# Patient Record
Sex: Male | Born: 1945 | Race: White | Hispanic: No | Marital: Married | Smoking: Former smoker
Health system: Southern US, Community
[De-identification: ages and names within clinical notes are randomized; demographics above are authoritative.]

## PROBLEM LIST (undated history)

## (undated) DIAGNOSIS — M1990 Unspecified osteoarthritis, unspecified site: Secondary | ICD-10-CM

## (undated) DIAGNOSIS — K5790 Diverticulosis of intestine, part unspecified, without perforation or abscess without bleeding: Secondary | ICD-10-CM

## (undated) DIAGNOSIS — I10 Essential (primary) hypertension: Secondary | ICD-10-CM

## (undated) DIAGNOSIS — K635 Polyp of colon: Secondary | ICD-10-CM

## (undated) HISTORY — DX: Polyp of colon: K63.5

## (undated) HISTORY — PX: APPENDECTOMY: SHX54

## (undated) HISTORY — PX: HYDROCELE EXCISION: SHX482

## (undated) HISTORY — PX: WISDOM TOOTH EXTRACTION: SHX21

## (undated) HISTORY — DX: Unspecified osteoarthritis, unspecified site: M19.90

## (undated) HISTORY — DX: Essential (primary) hypertension: I10

## (undated) HISTORY — DX: Diverticulosis of intestine, part unspecified, without perforation or abscess without bleeding: K57.90

---

## 1999-09-04 ENCOUNTER — Inpatient Hospital Stay (HOSPITAL_COMMUNITY): Admission: EM | Admit: 1999-09-04 | Discharge: 1999-09-06 | Payer: Self-pay | Admitting: Emergency Medicine

## 1999-09-04 ENCOUNTER — Encounter (INDEPENDENT_AMBULATORY_CARE_PROVIDER_SITE_OTHER): Payer: Self-pay | Admitting: Specialist

## 1999-09-04 ENCOUNTER — Encounter: Payer: Self-pay | Admitting: Emergency Medicine

## 1999-09-11 ENCOUNTER — Emergency Department (HOSPITAL_COMMUNITY): Admission: EM | Admit: 1999-09-11 | Discharge: 1999-09-11 | Payer: Self-pay | Admitting: *Deleted

## 2003-05-24 ENCOUNTER — Encounter (INDEPENDENT_AMBULATORY_CARE_PROVIDER_SITE_OTHER): Payer: Self-pay | Admitting: *Deleted

## 2003-05-24 ENCOUNTER — Ambulatory Visit (HOSPITAL_COMMUNITY): Admission: RE | Admit: 2003-05-24 | Discharge: 2003-05-24 | Payer: Self-pay | Admitting: Gastroenterology

## 2007-11-20 ENCOUNTER — Encounter: Admission: RE | Admit: 2007-11-20 | Discharge: 2007-11-20 | Payer: Self-pay | Admitting: Gastroenterology

## 2008-09-25 IMAGING — CT CT CHEST W/ CM
2 of 4 series · 15 of 36 positions shown, 18 images · IV contrast (75CC OMNI 300)
Comparison: None

CLINICAL DATA: Anterior chest wall pain.

CT CHEST WITH CONTRAST
TECHNIQUE: Multidetector CT imaging of the chest was performed
following the standard protocol during bolus administration of
intravenous contrast.
Contrast: 75 ml [id number].

[Series 3: routine chest · axial · 0.79mm/px · z∈[-290,-16]mm · 12 of 65 slices shown, 15 images]
[im 5/65  mediastinal]
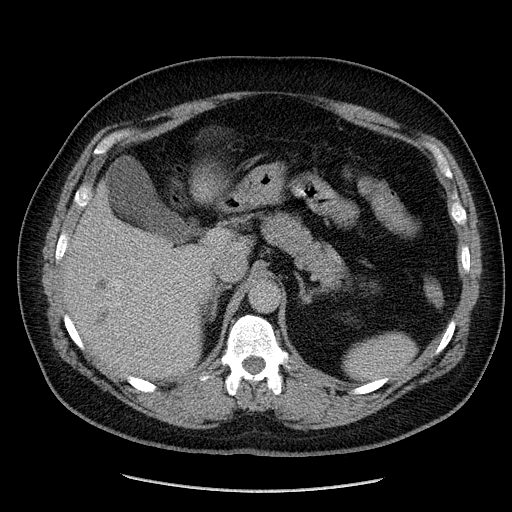
[im 5/65  lung]
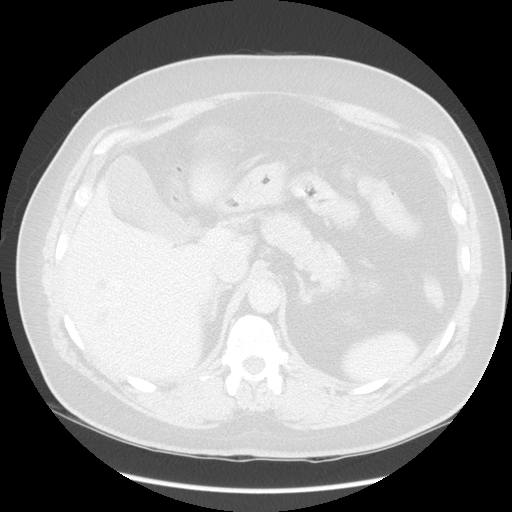
[im 10/65  lung]
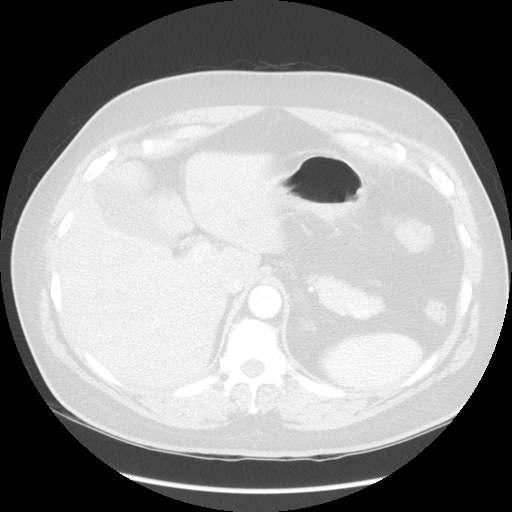
[im 14/65  lung]
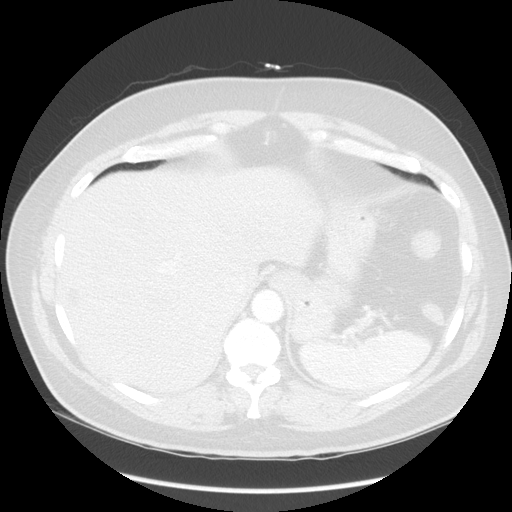
[im 19/65  lung]
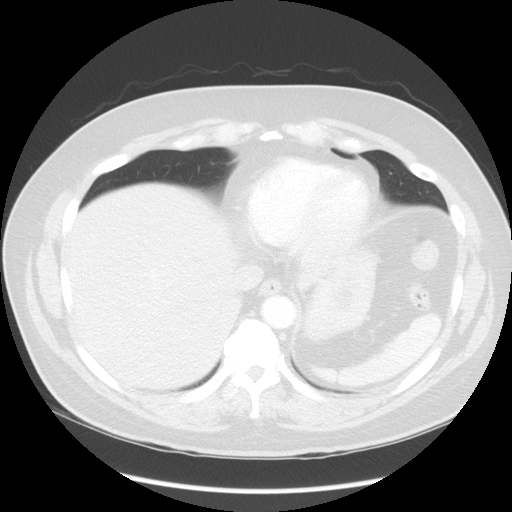
[im 23/65  mediastinal]
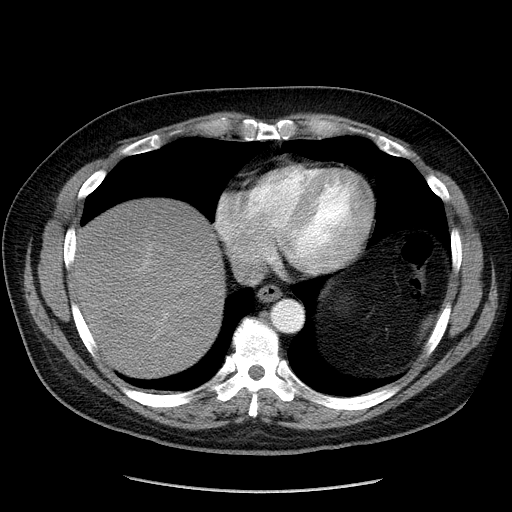
[im 23/65  lung]
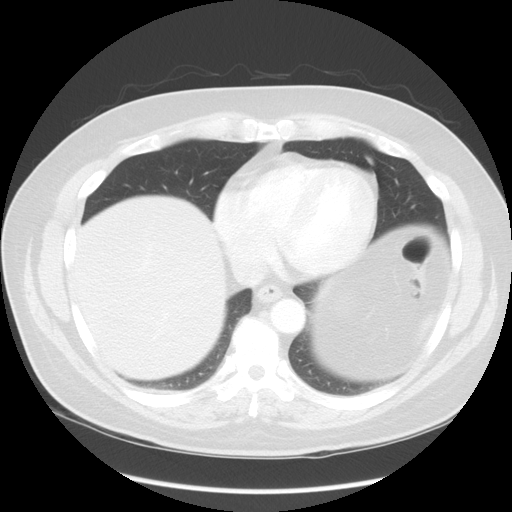
[im 28/65  lung]
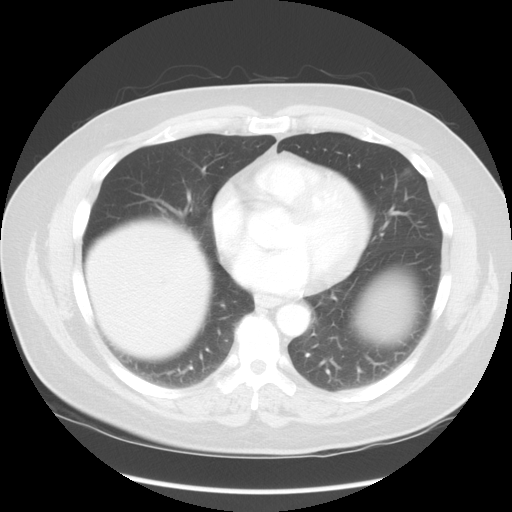
[im 37/65  lung]
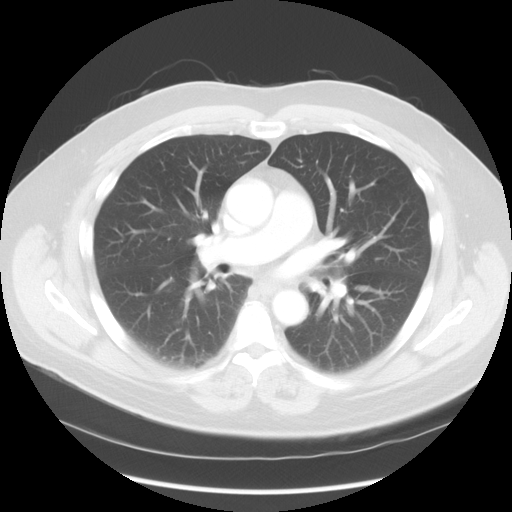
[im 42/65  lung]
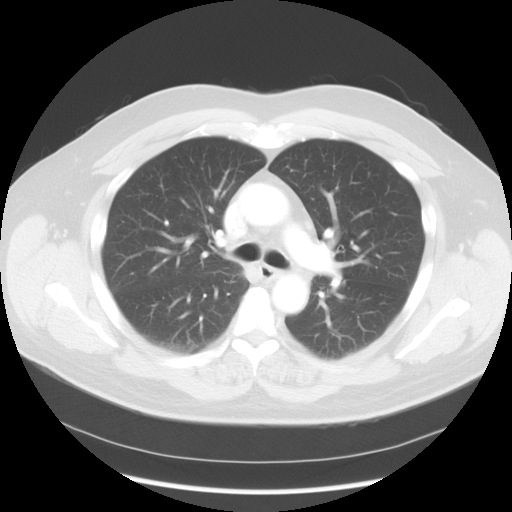
[im 46/65  mediastinal]
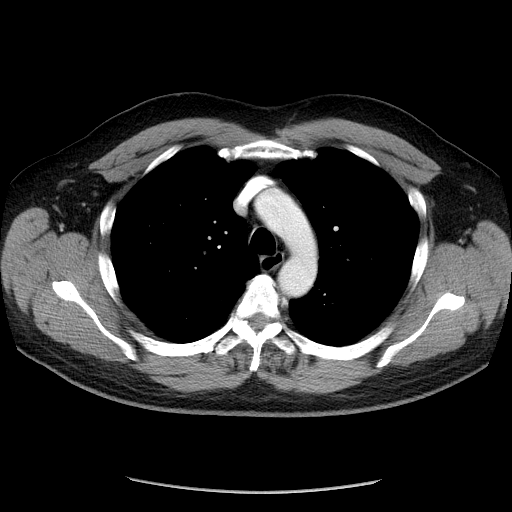
[im 46/65  lung]
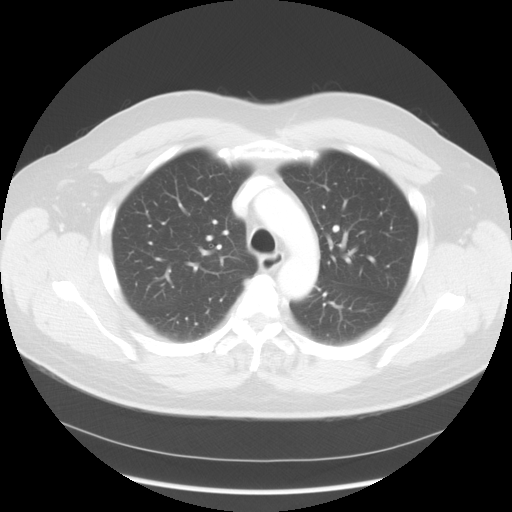
[im 51/65  lung]
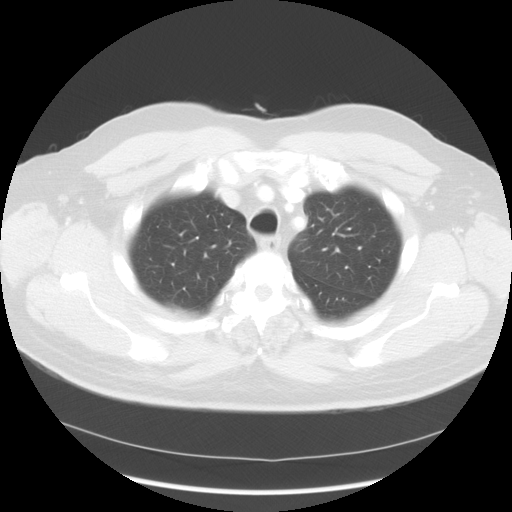
[im 55/65  lung]
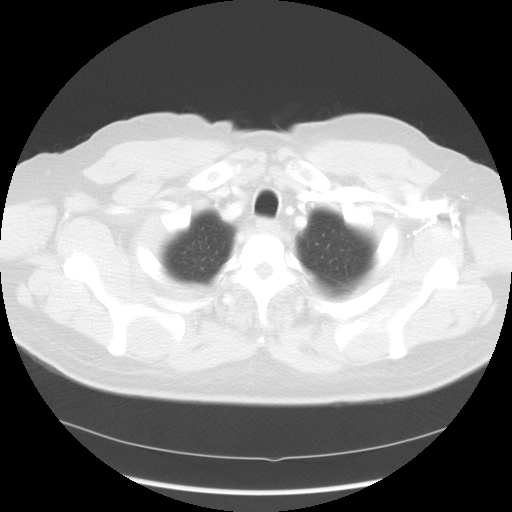
[im 60/65  lung]
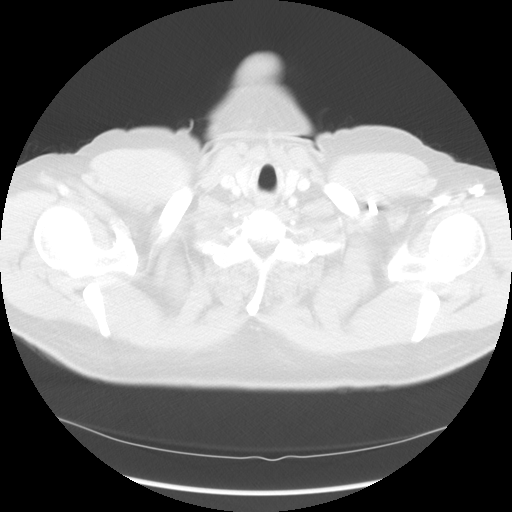

[Series 602: sagittal body · sagittal · 0.79mm/px · 3 of 163 slices shown]
[im 33/163  lung]
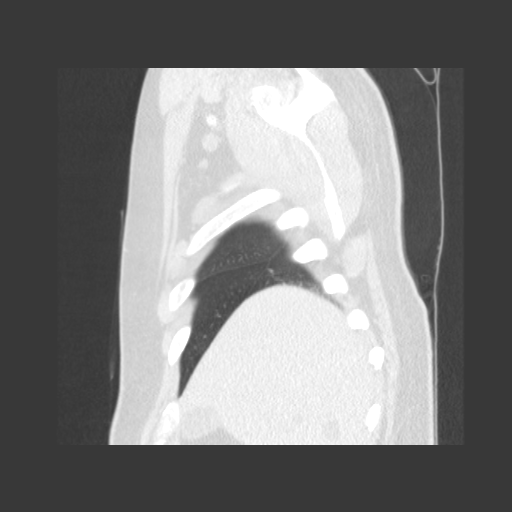
[im 65/163  lung]
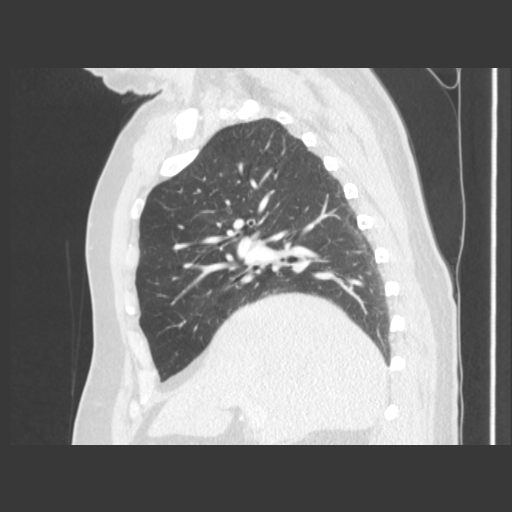
[im 98/163  lung]
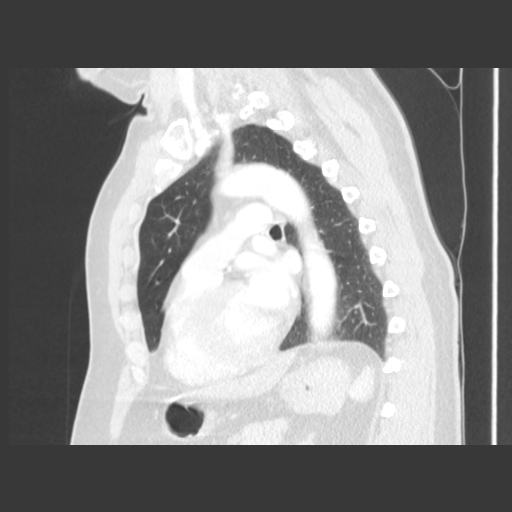

[15 of 36 positions shown; findings below may reference images not displayed]

FINDINGS: No pathologically enlarged mediastinal, hilar or axillary
lymph nodes.  Heart size normal.  No pericardial effusion.

There is scarring in the lingula.  Lungs otherwise clear.  No
pleural fluid.  Airway unremarkable.

Incidental imaging of the upper abdomen shows somewhat poorly
defined low attenuation lesions in the liver, measuring up to
cm in the right hepatic lobe.  No worrisome lytic or sclerotic
lesions.  No fracture.
IMPRESSION: 1.  No CT findings to explain the patient's given symptoms.
2.  Low attenuation lesions in the liver are somewhat ill-defined
and difficult to characterize on today's exam.  It is likely that
in the absence of known malignancy, these are cysts or hemangiomas.
However, further evaluation with MR could be performed, as
clinically indicated.

## 2009-02-17 ENCOUNTER — Emergency Department (HOSPITAL_COMMUNITY): Admission: EM | Admit: 2009-02-17 | Discharge: 2009-02-17 | Payer: Self-pay | Admitting: Emergency Medicine

## 2009-12-24 IMAGING — CR DG CHEST 1V PORT
1 series · 1 of 1 positions shown · non-contrast
Comparison: CT [DATE]/ 4446

CLINICAL DATA: Trauma

PORTABLE CHEST - 1 VIEW

[view not recorded]
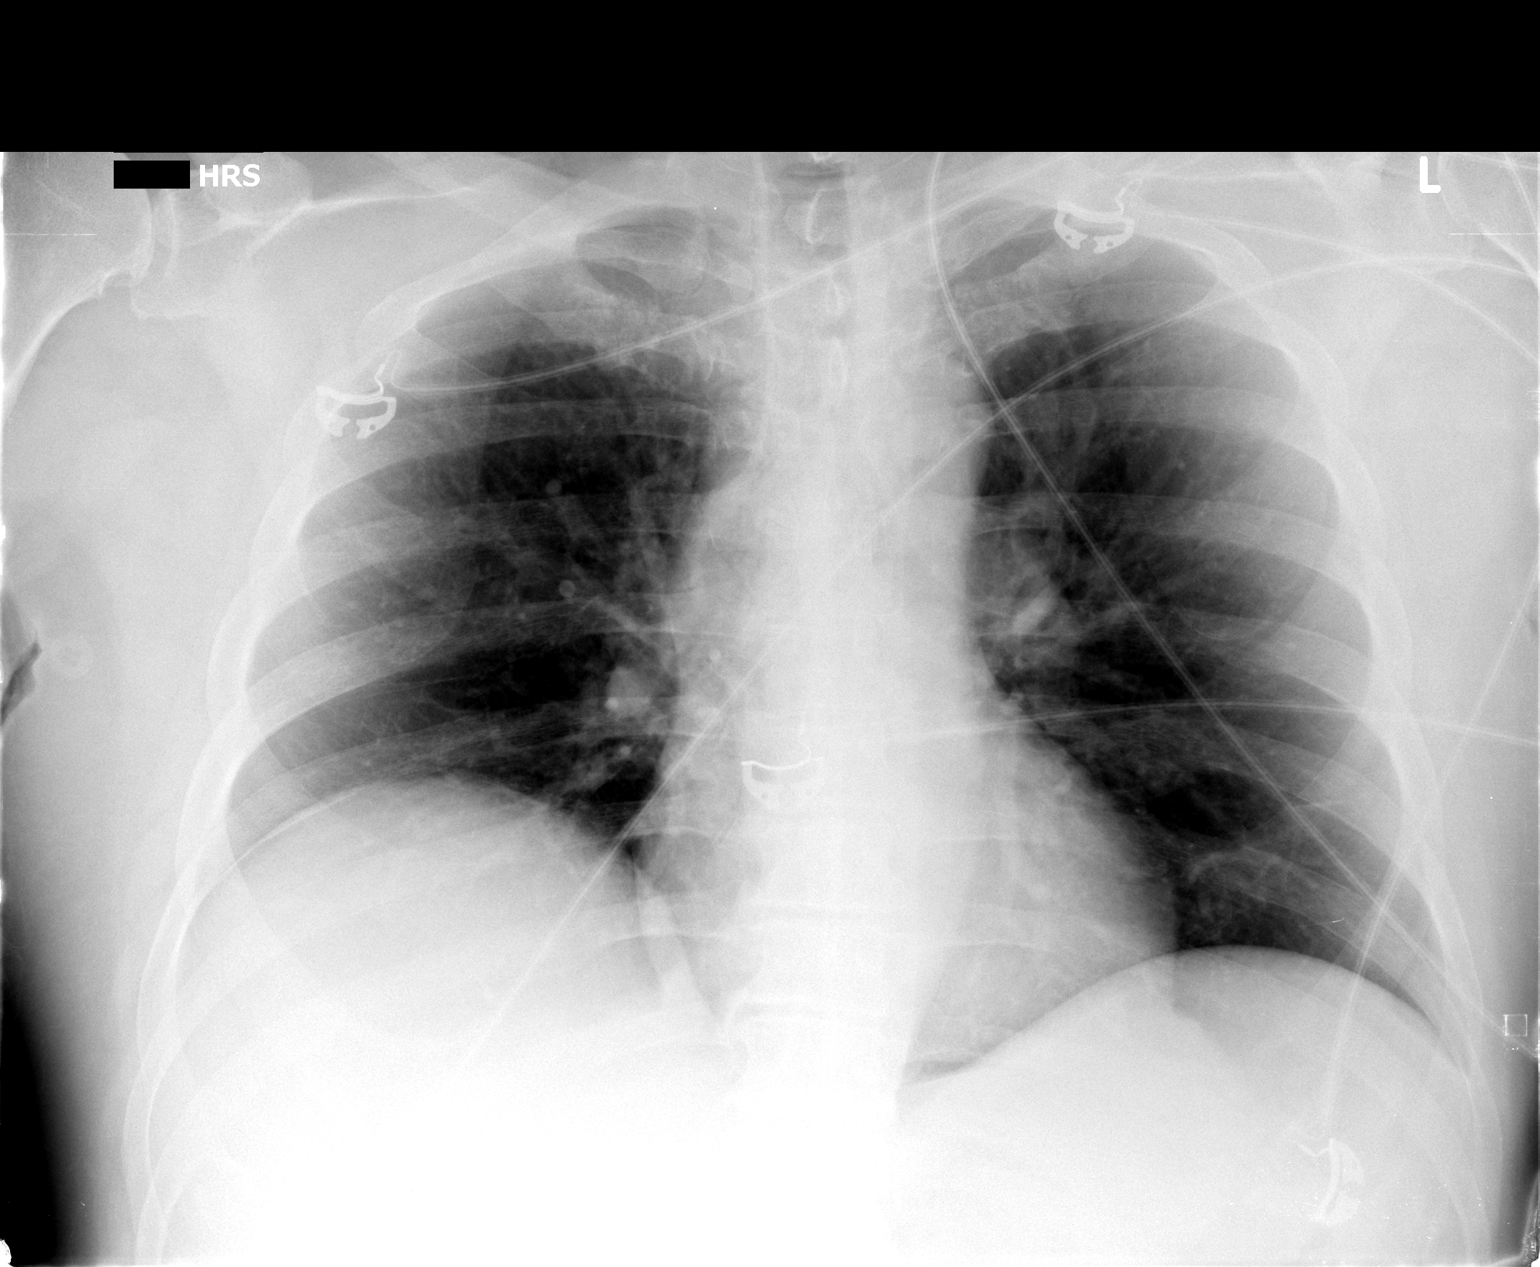

[1 of 1 positions shown; findings below may reference images not displayed]

FINDINGS: Normal mediastinum and cardiac silhouette.  Costophrenic
angles are clear.  No evidence of pleural fluid.  No pneumothorax.
No evidence of fracture.
IMPRESSION: No radiographic evidence of thoracic trauma.

## 2011-10-04 ENCOUNTER — Other Ambulatory Visit: Payer: Self-pay | Admitting: Gastroenterology

## 2017-06-27 ENCOUNTER — Telehealth: Payer: Self-pay | Admitting: Gastroenterology

## 2017-06-28 ENCOUNTER — Encounter: Payer: Self-pay | Admitting: Gastroenterology

## 2017-06-28 NOTE — Telephone Encounter (Signed)
Patient has been scheduled for an office visit 08/19/17 with Dr.Danis.

## 2017-06-28 NOTE — Telephone Encounter (Signed)
Please make him a new patient appointment to see me and I will review the records at that time. 

## 2017-08-19 ENCOUNTER — Encounter: Payer: Self-pay | Admitting: Gastroenterology

## 2017-08-19 ENCOUNTER — Ambulatory Visit (INDEPENDENT_AMBULATORY_CARE_PROVIDER_SITE_OTHER): Payer: Non-veteran care | Admitting: Gastroenterology

## 2017-08-19 VITALS — BP 124/68 | HR 70 | Ht 70.0 in | Wt 231.0 lb

## 2017-08-19 DIAGNOSIS — Z86010 Personal history of colonic polyps: Secondary | ICD-10-CM | POA: Diagnosis not present

## 2017-08-19 NOTE — Patient Instructions (Signed)
If you are age 72 or older, your body mass index should be between 23-30. Your Body mass index is 33.15 kg/m. If this is out of the aforementioned range listed, please consider follow up with your Primary Care Provider.  If you are age 72 or younger, your body mass index should be between 19-25. Your Body mass index is 33.15 kg/m. If this is out of the aformentioned range listed, please consider follow up with your Primary Care Provider.   You will be due for a recall colonoscopy in 3- 2023. We will send you a reminder in the mail when it gets closer to that time.  Thank you for choosing [facility]  Dr Amada JupiterHenry Danis III

## 2017-08-19 NOTE — Progress Notes (Signed)
[facility] Consult Note:  History: Tommy Stewart 08/19/2017  Referring physician: Eartha InchBadger, Michael C, MD  Reason for consult/chief complaint: Colon Cancer Screening (last colon was 10 years or more. pt denies GI sx.)   Subjective  HPI:  This is a 72 year old man [facility].  Review of available records reveals a colonoscopy with Dr. Reece AgarMarty Johnson in March 2013.  It was complete exam with a reportedly good preparation, and only 2 diminutive rectal hyperplastic polyps were removed. The patient denies chronic upper digestive symptoms such as heartburn, nausea, vomiting or dysphagia.  He denies chronic abdominal pain change in bowel habits or rectal bleeding.   ROS:  Review of Systems  He denies chest pain dyspnea or dysuria Past Medical History: Past Medical History:  Diagnosis Date  . Colon polyp   . Diverticulosis   . DJD (degenerative joint disease)   . Hypertension      Past Surgical History: Past Surgical History:  Procedure Laterality Date  . APPENDECTOMY    . HYDROCELE EXCISION    . WISDOM TOOTH EXTRACTION       Family History: Family History  Problem Relation Age of Onset  . Cancer - Other Mother        unknown primary   . COPD Father   . Heart disease Father        ASHD  . Hypertension Sister    No colon or rectal cancer  Social History: Social History   Socioeconomic History  . Marital status: Married    Spouse name: None  . Number of children: None  . Years of education: None  . Highest education level: None  Social Needs  . Financial resource strain: None  . Food insecurity - worry: None  . Food insecurity - inability: None  . Transportation needs - medical: None  . Transportation needs - non-medical: None  Occupational History  . None  Tobacco Use  . Smoking status: Former Games developermoker  . Smokeless tobacco: Former Engineer, waterUser  Substance and Sexual Activity    . Alcohol use: Yes    Frequency: Never    Comment: 3 x a week  . Drug use: No  . Sexual activity: None  Other Topics Concern  . None  Social History Narrative  . None    Allergies: Allergies  Allergen Reactions  . Hydrocodone Itching    Outpatient Meds: Current Outpatient Medications  Medication Sig Dispense Refill  . amLODipine (NORVASC) 5 MG tablet Take 5 mg by mouth daily.    . Cetirizine HCl 10 MG CAPS Take 1 capsule by mouth daily.    . Cholecalciferol (VITAMIN D3) 5000 units CAPS Take 2 capsules by mouth daily.    . Glucosamine-Chondroit-Vit C-Mn (GLUCOSAMINE 1500 COMPLEX PO) Take 2 capsules by mouth daily.    . hydrochlorothiazide (HYDRODIURIL) 12.5 MG tablet Take 12.5 mg by mouth daily.    Marland Kitchen. lisinopril (PRINIVIL,ZESTRIL) 40 MG tablet Take 40 mg by mouth daily.    . Omega-3 Fatty Acids (FISH OIL) 1200 MG CAPS Take 2 capsules by mouth daily.    . Turmeric (CURCUMIN 95) 500 MG CAPS Take 2 capsules by mouth daily.     No current facility-administered medications for this visit.       ___________________________________________________________________ Objective   Exam:  BP 124/68   Pulse 70   Ht 5\' 10"  (1.778 m)   Wt 231 lb (104.8 kg)   BMI  33.15 kg/m    General: this is a(n) well-appearing man  Eyes: sclera anicteric, no redness  ENT: oral mucosa moist without lesions, no cervical or supraclavicular lymphadenopathy, good dentition  CV: RRR without murmur, S1/S2, no JVD, no peripheral edema  Resp: clear to auscultation bilaterally, normal RR and effort noted  GI: soft, overweight, no tenderness, with active bowel sounds. No guarding or palpable organomegaly noted.  Skin; warm and dry, no rash or jaundice noted  Neuro: awake, alert and oriented x 3. Normal gross motor function and fluent speech  Labs:  Records as noted above  Assessment: Encounter Diagnosis  Name Primary?  . History of colonic polyps Yes    Although he was advised by Dr.  Laural Benes to have a repeat colonoscopy at a 5-year interval, current screening guidelines indicated should be a 10-year interval for him.  Plan:  We have placed the patient on our recall list from March 2023 since he plans to follow with [place] after Dr. Laural Benes retired last year.  Thank you for the courtesy of this consult.  Please call me with any questions or concerns.  Charlie Pitter III  CC: Eartha Inch, MD

## 2019-01-22 ENCOUNTER — Other Ambulatory Visit: Payer: Self-pay | Admitting: Internal Medicine

## 2019-01-22 ENCOUNTER — Other Ambulatory Visit: Payer: Self-pay

## 2019-01-22 DIAGNOSIS — Z20822 Contact with and (suspected) exposure to covid-19: Secondary | ICD-10-CM

## 2019-01-25 LAB — NOVEL CORONAVIRUS, NAA: SARS-CoV-2, NAA: NOT DETECTED

## 2019-01-26 ENCOUNTER — Telehealth: Payer: Self-pay | Admitting: *Deleted

## 2019-01-26 NOTE — Telephone Encounter (Signed)
Pt had called in earlier requesting his COVID-19 test result.   I was not able to locate him in the system under the name Tommy Stewart. After ending the call it occurred to me that his given name may be Tommy Stewart instead of [place].   I looked him up and found his chart in the system.   His COVID-19 result was non-detected. I called him back and told him how I found him in the system and that I was glad to report his COVID-19 test was negative meaning he did not have the virus.  He was very happy and appreciative of the result and that I looked into it further and called him back.    "You have made my holiday (July 4th) weekend!

## 2020-04-24 ENCOUNTER — Ambulatory Visit: Payer: Non-veteran care | Attending: Internal Medicine

## 2020-04-24 DIAGNOSIS — Z23 Encounter for immunization: Secondary | ICD-10-CM

## 2020-04-24 NOTE — Progress Notes (Signed)
   Covid-19 Vaccination Clinic  Name:  Tommy Stewart    MRN: [id number] DOB: [birth year]  04/24/2020  Tommy Stewart was observed post Covid-19 immunization for 15 minutes without incident. He was provided with Vaccine Information Sheet and instruction to access the V-Safe system.   Tommy Stewart was instructed to call 911 with any severe reactions post vaccine: Marland Kitchen Difficulty breathing  . Swelling of face and throat  . A fast heartbeat  . A bad rash all over body  . Dizziness and weakness

## 2021-07-06 LAB — COLOGUARD: COLOGUARD: NEGATIVE

## 2021-09-15 ENCOUNTER — Other Ambulatory Visit: Payer: Self-pay

## 2021-09-15 ENCOUNTER — Encounter: Payer: Self-pay | Admitting: Emergency Medicine

## 2021-09-15 ENCOUNTER — Ambulatory Visit
Admission: EM | Admit: 2021-09-15 | Discharge: 2021-09-15 | Disposition: A | Discharge status: Home / Self Care | Payer: Medicare HMO | Attending: Family Medicine | Admitting: Family Medicine

## 2021-09-15 DIAGNOSIS — S61011A Laceration without foreign body of right thumb without damage to nail, initial encounter: Secondary | ICD-10-CM

## 2021-09-15 MED ORDER — LIDOCAINE-EPINEPHRINE-TETRACAINE (LET) TOPICAL GEL
3.0000 mL | Freq: Once | TOPICAL | Status: AC
  Administered 2021-09-15: 3 mL via TOPICAL

## 2021-09-15 NOTE — ED Notes (Signed)
Dressing applied to laceration post dermabond administration by PA. Finger splint, Non adherent telfa applied to laceration site. Reinforced with gauze layer. Secured with coban. Pt tolerated well.   Site management education provided by PA.

## 2021-09-15 NOTE — ED Triage Notes (Signed)
Pt reports was cutting cheese at home and reports knife slipped and cut right thumb. Mild bleeding noted. PA in to assess site. LET applied verbal order. Pt tolerating well.

## 2021-09-15 NOTE — ED Provider Notes (Signed)
[facility]    CSN: [id number] Arrival date & time: 09/15/21  1718      History   Chief Complaint Chief Complaint  Patient presents with   Laceration    HPI Tommy Stewart is a 76 y.o. male.   Presenting today with a laceration to the dorsal surface of the right thumb that occurred several hours ago when he was cutting a wedge of cheese at home.  He states the bleeding has been poorly controlled with direct pressure thus far.  Range of motion intact, no numbness, tingling, damage to the nail.  Has run it under water thus far but otherwise not tried anything for it.  Not on any anticoagulation.   Past Medical History:  Diagnosis Date   Colon polyp    Diverticulosis    DJD (degenerative joint disease)    Hypertension     There are no problems to display for this patient.   Past Surgical History:  Procedure Laterality Date   APPENDECTOMY     HYDROCELE EXCISION     WISDOM TOOTH EXTRACTION         Home Medications    Prior to Admission medications   Medication Sig Start Date End Date Taking? Authorizing Provider  amLODipine (NORVASC) 5 MG tablet Take 5 mg by mouth daily.    [provider]  Cetirizine HCl 10 MG CAPS Take 1 capsule by mouth daily.    [provider]  Cholecalciferol (VITAMIN D3) 5000 units CAPS Take 2 capsules by mouth daily.    [provider]  Glucosamine-Chondroit-Vit C-Mn (GLUCOSAMINE 1500 COMPLEX PO) Take 2 capsules by mouth daily.    [provider]  hydrochlorothiazide (HYDRODIURIL) 12.5 MG tablet Take 12.5 mg by mouth daily.    [provider]  lisinopril (PRINIVIL,ZESTRIL) 40 MG tablet Take 40 mg by mouth daily.    [provider]  Omega-3 Fatty Acids (FISH OIL) 1200 MG CAPS Take 2 capsules by mouth daily.    [provider]  Turmeric (CURCUMIN 95) 500 MG CAPS Take 2 capsules by mouth daily.    [provider]    Family History Family History  Problem  Relation Age of Onset   Cancer - Other Mother        unknown primary    COPD Father    Heart disease Father        ASHD   Hypertension Sister     Social History Social History   Tobacco Use   Smoking status: Former   Smokeless tobacco: Former  Substance Use Topics   Alcohol use: Yes    Comment: 3 x a week   Drug use: No     Allergies   Hydrocodone   Review of Systems Review of Systems Per HPI  Physical Exam Triage Vital Signs ED Triage Vitals  Enc Vitals Group     BP 09/15/21 1731 128/74     Pulse Rate 09/15/21 1731 68     Resp 09/15/21 1731 18     Temp 09/15/21 1731 98.8 F (37.1 C)     Temp Source 09/15/21 1731 Oral     SpO2 09/15/21 1731 93 %     Weight 09/15/21 1732 230 lb (104.3 kg)     Height 09/15/21 1732 5\' 10"  (1.778 m)     Head Circumference --      Peak Flow --      Pain Score 09/15/21 1732 0     Pain Loc --  Pain Edu? --      Excl. in GC? --    No data found.  Updated Vital Signs BP 128/74 (BP Location: Right Arm)    Pulse 68    Temp 98.8 F (37.1 C) (Oral)    Resp 18    Ht 5\' 10"  (1.778 m)    Wt 230 lb (104.3 kg)    SpO2 93%    BMI 33.00 kg/m   Visual Acuity Right Eye Distance:   Left Eye Distance:   Bilateral Distance:    Right Eye Near:   Left Eye Near:    Bilateral Near:     Physical Exam Vitals and nursing note reviewed.  Constitutional:      Appearance: Normal appearance.  HENT:     Head: Atraumatic.  Eyes:     Extraocular Movements: Extraocular movements intact.     Conjunctiva/sclera: Conjunctivae normal.  Cardiovascular:     Rate and Rhythm: Normal rate and regular rhythm.  Pulmonary:     Effort: Pulmonary effort is normal.     Breath sounds: Normal breath sounds.  Musculoskeletal:        General: Signs of injury present. No swelling or tenderness. Normal range of motion.     Cervical back: Normal range of motion and neck supple.  Skin:    General: Skin is warm and dry.     Comments: 1.5 cm well approximated  laceration to the dorsal aspect of the DIP of the right thumb.  Bleeding well controlled after let gel applied for 15 minutes.  Neurological:     General: No focal deficit present.     Mental Status: He is oriented to person, place, and time.     Comments: Right hand neurovascularly intact  Psychiatric:        Mood and Affect: Mood normal.        Thought Content: Thought content normal.        Judgment: Judgment normal.     UC Treatments / Results  Labs (all labs ordered are listed, but only abnormal results are displayed) Labs Reviewed - No data to display  EKG   Radiology No results found.  Procedures Laceration Repair  Date/Time: 09/15/2021 7:28 PM Performed by: 09/17/2021, PA-C Authorized by: Particia Nearing, PA-C   Consent:    Consent obtained:  Verbal   Consent given by:  Patient   Risks, benefits, and alternatives were discussed: yes     Risks discussed:  Infection and pain   Alternatives discussed: Sutures. Universal protocol:    Procedure explained and questions answered to patient or proxy's satisfaction: yes     Relevant documents present and verified: yes     Test results available: yes     Patient identity confirmed:  Verbally with patient Anesthesia:    Anesthesia method:  Topical application   Topical anesthetic:  LET Laceration details:    Location:  Finger   Finger location:  R thumb   Length (cm):  1.5   Depth (mm):  2 Pre-procedure details:    Preparation:  Patient was prepped and draped in usual sterile fashion Exploration:    Limited defect created (wound extended): no     Hemostasis achieved with:  LET and direct pressure   Wound exploration: wound explored through full range of motion     Contaminated: no   Treatment:    Area cleansed with:  Chlorhexidine   Amount of cleaning:  Standard   Debridement:  None Skin  repair:    Repair method:  Tissue adhesive Approximation:    Approximation:  Close Repair type:     Repair type:  Simple Post-procedure details:    Dressing:  Splint for protection and non-adherent dressing   Procedure completion:  Tolerated well, no immediate complications (including critical care time)  Medications Ordered in UC Medications  lidocaine-EPINEPHrine-tetracaine (LET) topical gel (3 mLs Topical Given 09/15/21 1728)    Initial Impression / Assessment and Plan / UC Course  I have reviewed the triage vital signs and the nursing notes.  Pertinent labs & imaging results that were available during my care of the patient were reviewed by me and considered in my medical decision making (see chart for details).     Let gel applied in triage which controlled the bleeding well.  Skin glue was then applied and nonstick dressing, splint for protection.  Up-to-date on tetanus per patient, had it in the last year or 2.  Discussed home wound care, return precautions.  Final Clinical Impressions(s) / UC Diagnoses   Final diagnoses:  Laceration of right thumb without foreign body without damage to nail, initial encounter   Discharge Instructions   None    ED Prescriptions   None    PDMP not reviewed this encounter.   Particia Nearing, [place] 09/15/21 1930

## 2021-09-15 NOTE — ED Notes (Signed)
No bleeding noted at this time. Site bandaged with non-adherent pad and back in waiting room until treatment room opens. Pt verbalized understanding of care plan.

## 2024-06-02 ENCOUNTER — Ambulatory Visit: Admission: EM | Admit: 2024-06-02 | Discharge: 2024-06-02 | Disposition: A | Discharge status: Home / Self Care

## 2024-06-02 DIAGNOSIS — U071 COVID-19: Secondary | ICD-10-CM | POA: Diagnosis not present

## 2024-06-02 MED ORDER — PAXLOVID (300/100) 20 X 150 MG & 10 X 100MG PO TBPK: 3.0000 | ORAL_TABLET | Freq: Two times a day (BID) | ORAL | 0 refills | Status: AC

## 2024-06-02 MED ORDER — GUAIFENESIN 100 MG/5ML PO LIQD: 10.0000 mL | Freq: Four times a day (QID) | ORAL | 0 refills | Status: AC | PRN

## 2024-06-02 NOTE — Discharge Instructions (Signed)
 Take medication as prescribed. As discussed, you will need to hold your rosuvastatin (Crestor) for 2 weeks. Increase fluids and allow for plenty of rest. You may take over-the-counter Tylenol as needed for pain, fever, or general discomfort. Recommend the use of normal saline nasal spray throughout the day for nasal congestion and runny nose.  As discussed, it is recommended that you begin use of your nasal spray that you have at home. Recommend the use of a humidifier in your bedroom at nighttime during sleep and to sleep elevated on pillows while cough symptoms persist. Go to the emergency department if you experience shortness of breath, difficulty breathing, or other concerns. Follow-up with your primary care physician if symptoms fail to improve. Follow-up as needed.

## 2024-06-02 NOTE — ED Triage Notes (Signed)
 Pt reports is weak, runny nose, and a cough x 4 days    Expired home covid test was positive

## 2024-06-02 NOTE — ED Provider Notes (Signed)
 [facility]    CSN: [id number] Arrival date & time: 06/02/24  1007      History   Chief Complaint No chief complaint on file.   HPI Tommy Stewart is a 78 y.o. male.   The history is provided by the patient.   Patient presents for complaints of fatigue, cough, nasal congestion, and runny nose.  Patient states symptoms have been present for the past 4 days.  He denies fever, chills, wheezing, difficulty breathing, abdominal pain, nausea, vomiting, diarrhea, or rash.  Patient states that he did take a home COVID test which has expired, but states that the test was positive.  Patient denies any obvious close sick contacts.  Past Medical History:  Diagnosis Date   Colon polyp    Diverticulosis    DJD (degenerative joint disease)    Hypertension     There are no active problems to display for this patient.   Past Surgical History:  Procedure Laterality Date   APPENDECTOMY     HYDROCELE EXCISION     WISDOM TOOTH EXTRACTION         Home Medications    Prior to Admission medications   Medication Sig Start Date End Date Taking? Authorizing Provider  meloxicam (MOBIC) 7.5 MG tablet Take 7.5 mg by mouth. 10/14/23  Yes [provider]  rosuvastatin (CRESTOR) 10 MG tablet Take 10 mg by mouth. 12/26/23  Yes [provider]  amLODipine (NORVASC) 5 MG tablet Take 5 mg by mouth daily.    [provider]  Cetirizine HCl 10 MG CAPS Take 1 capsule by mouth daily.    [provider]  Cholecalciferol (VITAMIN D3) 5000 units CAPS Take 2 capsules by mouth daily.    [provider]  Glucosamine-Chondroit-Vit C-Mn (GLUCOSAMINE 1500 COMPLEX PO) Take 2 capsules by mouth daily.    [provider]  hydrochlorothiazide (HYDRODIURIL) 12.5 MG tablet Take 12.5 mg by mouth daily.    [provider]  lisinopril (PRINIVIL,ZESTRIL) 40 MG tablet Take 40 mg by mouth daily.    [provider]  Omega-3 Fatty Acids (FISH OIL)  1200 MG CAPS Take 2 capsules by mouth daily.    [provider]  tamsulosin (FLOMAX) 0.4 MG CAPS capsule Take by mouth.    [provider]  Turmeric (CURCUMIN 95) 500 MG CAPS Take 2 capsules by mouth daily.    [provider]    Family History Family History  Problem Relation Age of Onset   Cancer - Other Mother        unknown primary    COPD Father    Heart disease Father        ASHD   Hypertension Sister     Social History Social History   Tobacco Use   Smoking status: Former   Smokeless tobacco: Former  Substance Use Topics   Alcohol use: Yes    Comment: 3 x a week   Drug use: No     Allergies   Hydrocodone   Review of Systems Review of Systems Per HPI  Physical Exam Triage Vital Signs ED Triage Vitals  Encounter Vitals Group     BP 06/02/24 1112 134/69     Girls Systolic BP Percentile --      Girls Diastolic BP Percentile --      Boys Systolic BP Percentile --      Boys Diastolic BP Percentile --      Pulse Rate 06/02/24 1112 83  Resp 06/02/24 1112 18     Temp 06/02/24 1112 98.7 F (37.1 C)     Temp Source 06/02/24 1112 Oral     SpO2 06/02/24 1112 94 %     Weight --      Height --      Head Circumference --      Peak Flow --      Pain Score 06/02/24 1113 3     Pain Loc --      Pain Education --      Exclude from Growth Chart --    No data found.  Updated Vital Signs BP 134/69 (BP Location: Right Arm)   Pulse 83   Temp 98.7 F (37.1 C) (Oral)   Resp 18   SpO2 94%   Visual Acuity Right Eye Distance:   Left Eye Distance:   Bilateral Distance:    Right Eye Near:   Left Eye Near:    Bilateral Near:     Physical Exam Vitals and nursing note reviewed.  Constitutional:      General: He is not in acute distress.    Appearance: Normal appearance. He is well-developed.  HENT:     Head: Normocephalic and atraumatic.     Right Ear: Tympanic membrane, ear canal and external ear normal.     Left Ear: Tympanic  membrane, ear canal and external ear normal.     Nose: Congestion present.     Right Turbinates: Enlarged and swollen.     Left Turbinates: Enlarged and swollen.     Right Sinus: No maxillary sinus tenderness or frontal sinus tenderness.     Left Sinus: No maxillary sinus tenderness or frontal sinus tenderness.     Mouth/Throat:     Lips: Pink.     Mouth: Mucous membranes are moist.     Pharynx: Uvula midline. Posterior oropharyngeal erythema and postnasal drip present. No pharyngeal swelling, oropharyngeal exudate or uvula swelling.     Comments: Cobblestoning present to posterior oropharynx  Eyes:     Extraocular Movements: Extraocular movements intact.     Conjunctiva/sclera: Conjunctivae normal.     Pupils: Pupils are equal, round, and reactive to light.  Neck:     Thyroid: No thyromegaly.     Trachea: No tracheal deviation.  Cardiovascular:     Rate and Rhythm: Normal rate and regular rhythm.     Pulses: Normal pulses.     Heart sounds: Normal heart sounds.  Pulmonary:     Effort: Pulmonary effort is normal. No respiratory distress.     Breath sounds: Normal breath sounds. No stridor. No wheezing, rhonchi or rales.  Abdominal:     General: Bowel sounds are normal.     Palpations: Abdomen is soft.  Musculoskeletal:     Cervical back: Normal range of motion and neck supple.  Skin:    General: Skin is warm and dry.  Neurological:     General: No focal deficit present.     Mental Status: He is alert and oriented to person, place, and time.  Psychiatric:        Mood and Affect: Mood normal.        Behavior: Behavior normal.        Thought Content: Thought content normal.        Judgment: Judgment normal.      UC Treatments / Results  Labs (all labs ordered are listed, but only abnormal results are displayed) Labs Reviewed - No data to display  EKG  Radiology No results found.  Procedures Procedures (including critical care time)  Medications Ordered in  UC Medications - No data to display  Initial Impression / Assessment and Plan / UC Course  I have reviewed the triage vital signs and the nursing notes.  Pertinent labs & imaging results that were available during my care of the patient were reviewed by me and considered in my medical decision making (see chart for details).  On exam, the patient's lung sounds are clear throughout, room air sats are at 94%.  Patient took a home COVID test which was expired, but test was negative.  I do believe that the test result was positive for COVID, and will treat accordingly.  Patient denies history of kidney disease, or use of blood thinning medications.  States that he is on a statin for his cholesterol.  He has elected to begin Paxlovid.  Reviewed patient's chart and found outside labs through [facility] dated 01/17/24, creatinine at that time was 0.89, GFR 88, we will treat patient with Paxlovid.  Symptomatic treatment provided with guaifenesin 100 mg for cough.  Supportive care recommendations were provided and discussed with the patient to include fluids, rest, over-the-counter analgesics such as Tylenol, use of normal saline nasal spray, use of a humidifier during sleep.  Discussed indications with patient regarding ER follow-up.  Patient was in agreement with this plan of care and verbalizes understanding.  All questions were answered.  Patient stable for discharge.  Final Clinical Impressions(s) / UC Diagnoses   Final diagnoses:  None   Discharge Instructions   None    ED Prescriptions   None    PDMP not reviewed this encounter.   Gilmer Etta PARAS, NP 06/02/24 [phone]

## 2024-06-03 ENCOUNTER — Ambulatory Visit: Payer: Self-pay
